# Patient Record
Sex: Male | Born: 1985 | Hispanic: Yes | Marital: Married | Smoking: Never smoker
Health system: Southern US, Community
[De-identification: ages and names within clinical notes are randomized; demographics above are authoritative.]

## PROBLEM LIST (undated history)

## (undated) DIAGNOSIS — B019 Varicella without complication: Secondary | ICD-10-CM

## (undated) HISTORY — DX: Varicella without complication: B01.9

## (undated) HISTORY — PX: NO PAST SURGERIES: SHX2092

---

## 2015-02-03 ENCOUNTER — Encounter (HOSPITAL_COMMUNITY): Payer: Self-pay | Admitting: Emergency Medicine

## 2015-02-03 ENCOUNTER — Emergency Department (HOSPITAL_COMMUNITY)
Admission: EM | Admit: 2015-02-03 | Discharge: 2015-02-03 | Disposition: A | Discharge status: Home / Self Care | Payer: BLUE CROSS/BLUE SHIELD | Attending: Emergency Medicine | Admitting: Emergency Medicine

## 2015-02-03 DIAGNOSIS — Z79899 Other long term (current) drug therapy: Secondary | ICD-10-CM | POA: Insufficient documentation

## 2015-02-03 DIAGNOSIS — K2970 Gastritis, unspecified, without bleeding: Secondary | ICD-10-CM | POA: Diagnosis not present

## 2015-02-03 DIAGNOSIS — R1013 Epigastric pain: Secondary | ICD-10-CM

## 2015-02-03 LAB — COMPREHENSIVE METABOLIC PANEL
ALBUMIN: 4.7 g/dL (ref 3.5–5.0)
ALK PHOS: 109 U/L (ref 38–126)
ALT: 82 U/L — AB (ref 17–63)
ANION GAP: 8 (ref 5–15)
AST: 33 U/L (ref 15–41)
BUN: 17 mg/dL (ref 6–20)
CALCIUM: 9.3 mg/dL (ref 8.9–10.3)
CO2: 24 mmol/L (ref 22–32)
Chloride: 105 mmol/L (ref 101–111)
Creatinine, Ser: 1.09 mg/dL (ref 0.61–1.24)
GFR calc Af Amer: >60 mL/min (ref >60)
GFR calc non Af Amer: >60 mL/min (ref >60)
GLUCOSE: 118 mg/dL — AB (ref 65–99)
Potassium: 4.3 mmol/L (ref 3.5–5.1)
SODIUM: 137 mmol/L (ref 135–145)
Total Bilirubin: 0.7 mg/dL (ref 0.3–1.2)
Total Protein: 7.6 g/dL (ref 6.5–8.1)

## 2015-02-03 LAB — CBC
HCT: 45.4 % (ref 39.0–52.0)
HEMOGLOBIN: 15.9 g/dL (ref 13.0–17.0)
MCH: 30.6 pg (ref 26.0–34.0)
MCHC: 35 g/dL (ref 30.0–36.0)
MCV: 87.3 fL (ref 78.0–100.0)
Platelets: 213 10*3/uL (ref 150–400)
RBC: 5.2 MIL/uL (ref 4.22–5.81)
RDW: 12.5 % (ref 11.5–15.5)
WBC: 10.3 10*3/uL (ref 4.0–10.5)

## 2015-02-03 LAB — LIPASE, BLOOD: Lipase: 36 U/L (ref 22–51)

## 2015-02-03 MED ORDER — ONDANSETRON 4 MG PO TBDP
4.0000 mg | ORAL_TABLET | Freq: Once | ORAL | Status: AC | PRN
  Administered 2015-02-03: 4 mg via ORAL
  Filled 2015-02-03: qty 1

## 2015-02-03 MED ORDER — PANTOPRAZOLE SODIUM 40 MG PO TBEC
40.0000 mg | DELAYED_RELEASE_TABLET | Freq: Every day | ORAL | Status: DC
Start: 2015-02-03 — End: 2015-02-03
  Administered 2015-02-03: 40 mg via ORAL
  Filled 2015-02-03: qty 1

## 2015-02-03 MED ORDER — OMEPRAZOLE 20 MG PO CPDR: 20.0000 mg | DELAYED_RELEASE_CAPSULE | Freq: Two times a day (BID) | ORAL | Status: DC

## 2015-02-03 MED ORDER — GI COCKTAIL ~~LOC~~
30.0000 mL | Freq: Once | ORAL | Status: AC
  Administered 2015-02-03: 30 mL via ORAL
  Filled 2015-02-03: qty 30

## 2015-02-03 NOTE — Discharge Instructions (Signed)

## 2015-02-03 NOTE — ED Provider Notes (Signed)
CSN: [id number]     Arrival date & time 02/03/15  0348 History   First MD Initiated Contact with Patient 02/03/15 [phone]     Chief Complaint  Patient presents with  . Abdominal Pain      HPI  Patient presents evaluation of epigastric abdominal pain. He ate at a taco truck tonight and developed some pressure in his upper abdomen and some burning in his lower chest. Nauseated. The like he had gas. Took some Pepto-Bismol. Symptoms were not improving. His father drove him here. He states en route his symptoms have improved down to a 1/10. Mild nausea initially. None now. No vomiting.  History reviewed. No pertinent past medical history. History reviewed. No pertinent past surgical history. History reviewed. No pertinent family history. Social History  Substance Use Topics  . Smoking status: Never Smoker   . Smokeless tobacco: None  . Alcohol Use: Yes     Comment: occ    Review of Systems  Constitutional: Negative for fever, chills, diaphoresis, appetite change and fatigue.  HENT: Negative for mouth sores, sore throat and trouble swallowing.   Eyes: Negative for visual disturbance.  Respiratory: Negative for cough, chest tightness, shortness of breath and wheezing.   Cardiovascular: Negative for chest pain.  Gastrointestinal: Positive for nausea and abdominal pain. Negative for vomiting, diarrhea and abdominal distention.  Endocrine: Negative for polydipsia, polyphagia and polyuria.  Genitourinary: Negative for dysuria, frequency and hematuria.  Musculoskeletal: Negative for gait problem.  Skin: Negative for color change, pallor and rash.  Neurological: Negative for dizziness, syncope, light-headedness and headaches.  Hematological: Does not bruise/bleed easily.  Psychiatric/Behavioral: Negative for behavioral problems and confusion.      Allergies  Review of patient's allergies indicates no known allergies.  Home Medications   Prior to Admission medications   Medication Sig  Start Date End Date Taking? Authorizing Provider  bismuth subsalicylate (PEPTO BISMOL) 262 MG chewable tablet Chew 1,048 mg by mouth once.   Yes Historical Provider, MD  Omega-3 Fatty Acids (FISH OIL PO) Take 1 capsule by mouth daily.   Yes Historical Provider, MD  omeprazole (PRILOSEC) 20 MG capsule Take 1 capsule (20 mg total) by mouth 2 (two) times daily. 02/03/15   Rolland PorterMark Jaidon Ellery, MD   BP 116/44 mmHg  Pulse 77  Temp(Src) 97.6 F (36.4 C) (Oral)  Resp 21  Ht 5\' 7"  (1.702 m)  Wt 210 lb (95.255 kg)  BMI 32.88 kg/m2  SpO2 95% Physical Exam  Constitutional: He is oriented to person, place, and time. He appears well-developed and well-nourished. No distress.  HENT:  Head: Normocephalic.  Eyes: Conjunctivae are normal. Pupils are equal, round, and reactive to light. No scleral icterus.  Neck: Normal range of motion. Neck supple. No thyromegaly present.  Cardiovascular: Normal rate and regular rhythm.  Exam reveals no gallop and no friction rub.   No murmur heard. Pulmonary/Chest: Effort normal and breath sounds normal. No respiratory distress. He has no wheezes. He has no rales.  Abdominal: Soft. Bowel sounds are normal. He exhibits no distension. There is no tenderness. There is no rebound.  Musculoskeletal: Normal range of motion.  Neurological: He is alert and oriented to person, place, and time.  Skin: Skin is warm and dry. No rash noted.  Psychiatric: He has a normal mood and affect. His behavior is normal.    ED Course  Procedures (including critical care time) Labs Review Labs Reviewed  COMPREHENSIVE METABOLIC PANEL - Abnormal; Notable for the following:    Glucose,  Bld 118 (*)    ALT 82 (*)    All other components within normal limits  LIPASE, BLOOD  CBC    Imaging Review No results found. I have personally reviewed and evaluated these images and lab results as part of my medical decision-making.   EKG Interpretation None      MDM   Final diagnoses:  Epigastric  pain  Gastritis    Symptoms and findings consistent with gastritis. Reassuring labs. Plan is home. Avoid anti-inflammatories, tobacco caffeine. He quit antiacids when necessary. Prilosec course.    Rolland Porter, MD 02/03/15 [phone]

## 2015-02-03 NOTE — ED Notes (Signed)
Patient c/o generalized abd pain after eating at a taco truck tonight. Patient states he felt like the food "didn't settle well", but now for past 3 hours has had bad abd pain. Patient took pepto bismol @ 2hours ago. Patient denies vomiting, but feels nauseated, denies diarrhea.

## 2015-05-21 ENCOUNTER — Other Ambulatory Visit: Payer: BLUE CROSS/BLUE SHIELD

## 2015-05-21 ENCOUNTER — Ambulatory Visit (INDEPENDENT_AMBULATORY_CARE_PROVIDER_SITE_OTHER): Payer: BLUE CROSS/BLUE SHIELD | Admitting: Internal Medicine

## 2015-05-21 ENCOUNTER — Encounter: Payer: Self-pay | Admitting: Internal Medicine

## 2015-05-21 VITALS — BP 102/76 | HR 76 | Temp 98.2°F | Resp 18 | Ht 67.0 in | Wt 211.0 lb

## 2015-05-21 DIAGNOSIS — B370 Candidal stomatitis: Secondary | ICD-10-CM

## 2015-05-21 DIAGNOSIS — Z0000 Encounter for general adult medical examination without abnormal findings: Secondary | ICD-10-CM | POA: Diagnosis not present

## 2015-05-21 DIAGNOSIS — K219 Gastro-esophageal reflux disease without esophagitis: Secondary | ICD-10-CM | POA: Diagnosis not present

## 2015-05-21 DIAGNOSIS — N509 Disorder of male genital organs, unspecified: Secondary | ICD-10-CM

## 2015-05-21 MED ORDER — THERA VITAL M PO TABS: 1.0000 | ORAL_TABLET | Freq: Every day | ORAL | Status: DC

## 2015-05-21 MED ORDER — OMEPRAZOLE 20 MG PO CPDR: 20.0000 mg | DELAYED_RELEASE_CAPSULE | Freq: Every day | ORAL | Status: DC

## 2015-05-21 NOTE — Progress Notes (Signed)
Pre visit review using our clinic review tool, if applicable. No additional management support is needed unless otherwise documented below in the visit note. 

## 2015-05-21 NOTE — Assessment & Plan Note (Signed)
Experiencing frequent GERD Review GERD diet Reviewed potential consequences of uncontrolled GERD Start omeprazole 20 mg daily-advised that he should take for at least 4 weeks and then try discontinuing Encouraged weight loss Discussed that this may be contributing to his bad breath Follow-up as needed

## 2015-05-21 NOTE — Patient Instructions (Signed)
We have reviewed your prior records including labs and tests today.  Test(s) ordered today. Your results will be released to MyChart (or called to you) after review, usually within 72hours after test completion. If any changes need to be made, you will be notified at that same time.  All other Health Maintenance issues reviewed.   All recommended immunizations and age-appropriate screenings are up-to-date.  No immunizations administered today.   Medications reviewed and updated.  Changes include starting omeprazole for your heartburn.  Take for 4 weeks and then stop.    Your prescription(s) have been submitted to your pharmacy. Please take as directed and contact our office if you believe you are having problem(s) with the medication(s).  A referral was ordered for urology  Gastroesophageal Reflux Disease, Adult Normally, food travels down the esophagus and stays in the stomach to be digested. However, when a person has gastroesophageal reflux disease (GERD), food and stomach acid move back up into the esophagus. When this happens, the esophagus becomes sore and inflamed. Over time, GERD can create small holes (ulcers) in the lining of the esophagus.  CAUSES This condition is caused by a problem with the muscle between the esophagus and the stomach (lower esophageal sphincter, or LES). Normally, the LES muscle closes after food passes through the esophagus to the stomach. When the LES is weakened or abnormal, it does not close properly, and that allows food and stomach acid to go back up into the esophagus. The LES can be weakened by certain dietary substances, medicines, and medical conditions, including:  Tobacco use.  Pregnancy.  Having a hiatal hernia.  Heavy alcohol use.  Certain foods and beverages, such as coffee, chocolate, onions, and peppermint. RISK FACTORS This condition is more likely to develop in:  People who have an increased body weight.  People who have connective  tissue disorders.  People who use NSAID medicines. SYMPTOMS Symptoms of this condition include:  Heartburn.  Difficult or painful swallowing.  The feeling of having a lump in the throat.  Abitter taste in the mouth.  Bad breath.  Having a large amount of saliva.  Having an upset or bloated stomach.  Belching.  Chest pain.  Shortness of breath or wheezing.  Ongoing (chronic) cough or a night-time cough.  Wearing away of tooth enamel.  Weight loss. Different conditions can cause chest pain. Make sure to see your health care provider if you experience chest pain. DIAGNOSIS Your health care provider will take a medical history and perform a physical exam. To determine if you have mild or severe GERD, your health care provider may also monitor how you respond to treatment. You may also have other tests, including:  An endoscopy toexamine your stomach and esophagus with a small camera.  A test thatmeasures the acidity level in your esophagus.  A test thatmeasures how much pressure is on your esophagus.  A barium swallow or modified barium swallow to show the shape, size, and functioning of your esophagus. TREATMENT The goal of treatment is to help relieve your symptoms and to prevent complications. Treatment for this condition may vary depending on how severe your symptoms are. Your health care provider may recommend:  Changes to your diet.  Medicine.  Surgery. HOME CARE INSTRUCTIONS Diet  Follow a diet as recommended by your health care provider. This may involve avoiding foods and drinks such as:  Coffee and tea (with or without caffeine).  Drinks that containalcohol.  Energy drinks and sports drinks.  Carbonated drinks  or sodas.  Chocolate and cocoa.  Peppermint and mint flavorings.  Garlic and onions.  Horseradish.  Spicy and acidic foods, including peppers, chili powder, curry powder, vinegar, hot sauces, and barbecue sauce.  Citrus fruit  juices and citrus fruits, such as oranges, lemons, and limes.  Tomato-based foods, such as red sauce, chili, salsa, and pizza with red sauce.  Fried and fatty foods, such as donuts, french fries, potato chips, and high-fat dressings.  High-fat meats, such as hot dogs and fatty cuts of red and white meats, such as rib eye steak, sausage, ham, and bacon.  High-fat dairy items, such as whole milk, butter, and cream cheese.  Eat small, frequent meals instead of large meals.  Avoid drinking large amounts of liquid with your meals.  Avoid eating meals during the 2-3 hours before bedtime.  Avoid lying down right after you eat.  Do not exercise right after you eat. General Instructions  Pay attention to any changes in your symptoms.  Take over-the-counter and prescription medicines only as told by your health care provider. Do not take aspirin, ibuprofen, or other NSAIDs unless your health care provider told you to do so.  Do not use any tobacco products, including cigarettes, chewing tobacco, and e-cigarettes. If you need help quitting, ask your health care provider.  Wear loose-fitting clothing. Do not wear anything tight around your waist that causes pressure on your abdomen.  Raise (elevate) the head of your bed 6 inches (15cm).  Try to reduce your stress, such as with yoga or meditation. If you need help reducing stress, ask your health care provider.  If you are overweight, reduce your weight to an amount that is healthy for you. Ask your health care provider for guidance about a safe weight loss goal.  Keep all follow-up visits as told by your health care provider. This is important. SEEK MEDICAL CARE IF:  You have new symptoms.  You have unexplained weight loss.  You have difficulty swallowing, or it hurts to swallow.  You have wheezing or a persistent cough.  Your symptoms do not improve with treatment.  You have a hoarse voice. SEEK IMMEDIATE MEDICAL CARE  IF:  You have pain in your arms, neck, jaw, teeth, or back.  You feel sweaty, dizzy, or light-headed.  You have chest pain or shortness of breath.  You vomit and your vomit looks like blood or coffee grounds.  You faint.  Your stool is bloody or black.  You cannot swallow, drink, or eat.   This information is not intended to replace advice given to you by your health care provider. Make sure you discuss any questions you have with your health care provider.   Document Released: 01/15/2005 Document Revised: 12/27/2014 Document Reviewed: 08/02/2014 Elsevier Interactive Patient Education 2016 [employer].    Health Maintenance, Male A healthy lifestyle and preventative care can promote health and wellness.  Maintain regular health, dental, and eye exams.  Eat a healthy diet. Foods like vegetables, fruits, whole grains, low-fat dairy products, and lean protein foods contain the nutrients you need and are low in calories. Decrease your intake of foods high in solid fats, added sugars, and salt. Get information about a proper diet from your health care provider, if necessary.  Regular physical exercise is one of the most important things you can do for your health. Most adults should get at least 150 minutes of moderate-intensity exercise (any activity that increases your heart rate and causes you to sweat) each week.  In addition, most adults need muscle-strengthening exercises on 2 or more days a week.   Maintain a healthy weight. The body mass index (BMI) is a screening tool to identify possible weight problems. It provides an estimate of body fat based on height and weight. Your health care provider can find your BMI and can help you achieve or maintain a healthy weight. For males 20 years and older:  A BMI below 18.5 is considered underweight.  A BMI of 18.5 to 24.9 is normal.  A BMI of 25 to 29.9 is considered overweight.  A BMI of 30 and above is considered  obese.  Maintain normal blood lipids and cholesterol by exercising and minimizing your intake of saturated fat. Eat a balanced diet with plenty of fruits and vegetables. Blood tests for lipids and cholesterol should begin at age 44 and be repeated every 5 years. If your lipid or cholesterol levels are high, you are over age 1, or you are at high risk for heart disease, you may need your cholesterol levels checked more frequently.Ongoing high lipid and cholesterol levels should be treated with medicines if diet and exercise are not working.  If you smoke, find out from your health care provider how to quit. If you do not use tobacco, do not start.  Lung cancer screening is recommended for adults aged 55-80 years who are at high risk for developing lung cancer because of a history of smoking. A yearly low-dose CT scan of the lungs is recommended for people who have at least a 30-pack-year history of smoking and are current smokers or have quit within the past 15 years. A pack year of smoking is smoking an average of 1 pack of cigarettes a day for 1 year (for example, a 30-pack-year history of smoking could mean smoking 1 pack a day for 30 years or 2 packs a day for 15 years). Yearly screening should continue until the smoker has stopped smoking for at least 15 years. Yearly screening should be stopped for people who develop a health problem that would prevent them from having lung cancer treatment.  If you choose to drink alcohol, do not have more than 2 drinks per day. One drink is considered to be 12 oz (360 mL) of beer, 5 oz (150 mL) of wine, or 1.5 oz (45 mL) of liquor.  Avoid the use of street drugs. Do not share needles with anyone. Ask for help if you need support or instructions about stopping the use of drugs.  High blood pressure causes heart disease and increases the risk of stroke. High blood pressure is more likely to develop in:  People who have blood pressure in the end of the normal  range (100-139/85-89 mm Hg).  People who are overweight or obese.  People who are African American.  If you are 58-17 years of age, have your blood pressure checked every 3-5 years. If you are 68 years of age or older, have your blood pressure checked every year. You should have your blood pressure measured twice--once when you are at a hospital or clinic, and once when you are not at a hospital or clinic. Record the average of the two measurements. To check your blood pressure when you are not at a hospital or clinic, you can use:  An automated blood pressure machine at a pharmacy.  A home blood pressure monitor.  If you are 61-82 years old, ask your health care provider if you should take aspirin to prevent heart  disease.  Diabetes screening involves taking a blood sample to check your fasting blood sugar level. This should be done once every 3 years after age 54 if you are at a normal weight and without risk factors for diabetes. Testing should be considered at a younger age or be carried out more frequently if you are overweight and have at least 1 risk factor for diabetes.  Colorectal cancer can be detected and often prevented. Most routine colorectal cancer screening begins at the age of 53 and continues through age 90. However, your health care provider may recommend screening at an earlier age if you have risk factors for colon cancer. On a yearly basis, your health care provider may provide home test kits to check for hidden blood in the stool. A small camera at the end of a tube may be used to directly examine the colon (sigmoidoscopy or colonoscopy) to detect the earliest forms of colorectal cancer. Talk to your health care provider about this at age 17 when routine screening begins. A direct exam of the colon should be repeated every 5-10 years through age 1, unless early forms of precancerous polyps or small growths are found.  People who are at an increased risk for hepatitis B  should be screened for this virus. You are considered at high risk for hepatitis B if:  You were born in a country where hepatitis B occurs often. Talk with your health care provider about which countries are considered high risk.  Your parents were born in a high-risk country and you have not received a shot to protect against hepatitis B (hepatitis B vaccine).  You have HIV or AIDS.  You use needles to inject street drugs.  You live with, or have sex with, someone who has hepatitis B.  You are a man who has sex with other men (MSM).  You get hemodialysis treatment.  You take certain medicines for conditions like cancer, organ transplantation, and autoimmune conditions.  Hepatitis C blood testing is recommended for all people born from [birth year] through 1967 and any individual with known risk factors for hepatitis C.  Healthy men should no longer receive prostate-specific antigen (PSA) blood tests as part of routine cancer screening. Talk to your health care provider about prostate cancer screening.  Testicular cancer screening is not recommended for adolescents or adult males who have no symptoms. Screening includes self-exam, a health care provider exam, and other screening tests. Consult with your health care provider about any symptoms you have or any concerns you have about testicular cancer.  Practice safe sex. Use condoms and avoid high-risk sexual practices to reduce the spread of sexually transmitted infections (STIs).  You should be screened for STIs, including gonorrhea and chlamydia if:  You are sexually active and are younger than 24 years.  You are older than 24 years, and your health care provider tells you that you are at risk for this type of infection.  Your sexual activity has changed since you were last screened, and you are at an increased risk for chlamydia or gonorrhea. Ask your health care provider if you are at risk.  If you are at risk of being infected with  HIV, it is recommended that you take a prescription medicine daily to prevent HIV infection. This is called pre-exposure prophylaxis (PrEP). You are considered at risk if:  You are a man who has sex with other men (MSM).  You are a heterosexual man who is sexually active with multiple partners.  You take drugs by injection.  You are sexually active with a partner who has HIV.  Talk with your health care provider about whether you are at high risk of being infected with HIV. If you choose to begin PrEP, you should first be tested for HIV. You should then be tested every 3 months for as long as you are taking PrEP.  Use sunscreen. Apply sunscreen liberally and repeatedly throughout the day. You should seek shade when your shadow is shorter than you. Protect yourself by wearing long sleeves, pants, a wide-brimmed hat, and sunglasses year round whenever you are outdoors.  Tell your health care provider of new moles or changes in moles, especially if there is a change in shape or color. Also, tell your health care provider if a mole is larger than the size of a pencil eraser.  A one-time screening for abdominal aortic aneurysm (AAA) and surgical repair of large AAAs by ultrasound is recommended for men aged 65-75 years who are current or former smokers.  Stay current with your vaccines (immunizations).   This information is not intended to replace advice given to you by your health care provider. Make sure you discuss any questions you have with your health care provider.   Document Released: 10/04/2007 Document Revised: 04/28/2014 Document Reviewed: 09/02/2010 Elsevier Interactive Patient Education [employer]

## 2015-05-21 NOTE — Progress Notes (Signed)
Subjective:    Patient ID: Alexander Mcclain, male    DOB: October 06, 1985, 30 y.o.   MRN: [id number]  HPI He is here to establish with a new primary care physician and would like a physical.  He occasional has blood on the tissue when he wipes after having a bowel movement.  It occurs randomly.  He does have history of a hemorrhoids.  He denies blood in his stool.  He sometimes goes frequently - up to 3 times a day, which is when he typically sees the blood on tissue. He denies any palpable hemorrhoid. He denies any rectal pain. His weight has been stable and he denies change in appetite. He denies abdominal pain. He denies a family history of colon cancer or inflammatory bowel disease.   He has a wart or a mole on his scrotum.  It has been there a year.  No pain, no itching.  No change.  It is raised a little. His wife does have HPV.   He brushes his teeth and scrubs his tongue and this tongue will always be white.  He has bad breath and can not control it.  He does not see his dentist regularly-It has been a while.  He occasionally has GERD.  He has had gastritis.  He  typically it is has GERD 1-2 times a week.  He eats a lot of spicy foods.    Medications and allergies reviewed with patient and updated if appropriate.  Patient Active Problem List   Diagnosis Date Noted  . GERD (gastroesophageal reflux disease) 05/21/2015    Current Outpatient Prescriptions on File Prior to Visit  Medication Sig Dispense Refill  . Omega-3 Fatty Acids (FISH OIL PO) Take 1 capsule by mouth daily.     No current facility-administered medications on file prior to visit.    Past Medical History  Diagnosis Date  . Chicken pox     Past Surgical History  Procedure Laterality Date  . No past surgeries      Social History   Social History  . Marital Status: Married    Spouse Name: N/A  . Number of Children: N/A  . Years of Education: N/A   Social History Main Topics  . Smoking status: Never Smoker     . Smokeless tobacco: Never Used  . Alcohol Use: Yes     Comment: occ  . Drug Use: No  . Sexual Activity: Not Asked   Other Topics Concern  . None   Social History Narrative   Exercise: regular at least twice a week    Family History  Problem Relation Age of Onset  . Stroke Maternal Grandmother   . Diabetes Maternal Grandmother   . Diabetes Paternal Grandmother   . Diabetes Paternal Grandfather   . Hyperlipidemia Father   . Diabetes Maternal Grandfather     Review of Systems  Constitutional: Negative for fever, chills, appetite change and unexpected weight change.  HENT: Positive for congestion (allergies) and postnasal drip (allergies). Negative for hearing loss and trouble swallowing.   Eyes: Negative for visual disturbance.  Respiratory: Positive for cough and wheezing (related to allergies). Negative for shortness of breath.   Cardiovascular: Negative for chest pain, palpitations and leg swelling.  Gastrointestinal: Negative for nausea, abdominal pain, diarrhea, constipation and blood in stool.       GERD 1-2 times a week, stool vary diarrhea - constipation, nothing consistent  Genitourinary: Positive for genital sores (on scrotum). Negative for dysuria and hematuria.  Musculoskeletal: Negative for myalgias, back pain and arthralgias.  Neurological: Negative for dizziness, light-headedness and headaches.  Psychiatric/Behavioral: Negative for dysphoric mood. The patient is not nervous/anxious.        Objective:   Filed Vitals:   05/21/15 0900  BP: 102/76  Pulse: 76  Temp: 98.2 F (36.8 C)  Resp: 18   Filed Weights   05/21/15 0900  Weight: 211 lb (95.709 kg)   Body mass index is 33.04 kg/(m^2).   Physical Exam Constitutional: He appears well-developed and well-nourished. No distress.  HENT:  Head: Normocephalic and atraumatic.  Right Ear: External ear normal.  Left Ear: External ear normal.  Mouth/Throat: Oropharynx is  moist. Tongue and roof of mouth  has a white coating  Normal ear canals and TM b/l  Eyes: Conjunctivae and EOM are normal.  Neck: Neck supple. No tracheal deviation present. No thyromegaly present.  No carotid bruit  Cardiovascular: Normal rate, regular rhythm, normal heart sounds and intact distal pulses.   No murmur heard. Pulmonary/Chest: Effort normal and breath sounds normal. No respiratory distress. He has no wheezes. He has no rales.  Abdominal: Soft. Bowel sounds are normal. He exhibits no distension. There is no tenderness.  Genitourinary: white colored raised lesion on scrotum, nontender, no other abnormalities  Musculoskeletal: He exhibits no edema.  Lymphadenopathy:    He has no cervical adenopathy.  Skin: Skin is warm and dry. He is not diaphoretic.  Psychiatric: He has a normal mood and affect. His behavior is normal.         Assessment & Plan:    Physical exam: Screening blood work ordered Immunizations up-to-date Stressed increasing his regular exercise Advised weight loss No evidence of substance abuse We will refer to urology for further evaluation of the skin at the scrotum-possible HPV versus benign cyst Discussed self testicular exams Discussed skin cancer prevention awareness  Possible thrush-white coating on tongue and roof of mouth  Tongue culture sent Encouraged him to follow-up with his dentist, especially since he has not been seen recently and is experiencing not only bad breath, but also with coating on his tongue and roof of mouth  Advised to follow-up at least once a year, sooner depending on results of blood work

## 2015-05-28 LAB — CULTURE, FUNGUS WITHOUT SMEAR: ORGANISM ID, BACTERIA: NO GROWTH

## 2015-05-29 ENCOUNTER — Encounter: Payer: Self-pay | Admitting: Internal Medicine

## 2015-06-14 ENCOUNTER — Other Ambulatory Visit (INDEPENDENT_AMBULATORY_CARE_PROVIDER_SITE_OTHER): Payer: BLUE CROSS/BLUE SHIELD

## 2015-06-14 DIAGNOSIS — Z0000 Encounter for general adult medical examination without abnormal findings: Secondary | ICD-10-CM

## 2015-06-14 LAB — COMPREHENSIVE METABOLIC PANEL
ALBUMIN: 4.8 g/dL (ref 3.5–5.2)
ALT: 80 U/L — ABNORMAL HIGH (ref 0–53)
AST: 32 U/L (ref 0–37)
Alkaline Phosphatase: 94 U/L (ref 39–117)
BUN: 14 mg/dL (ref 6–23)
CALCIUM: 9.8 mg/dL (ref 8.4–10.5)
CHLORIDE: 103 meq/L (ref 96–112)
CO2: 29 meq/L (ref 19–32)
Creatinine, Ser: 0.92 mg/dL (ref 0.40–1.50)
GFR: 101.7 mL/min (ref >60.00)
Glucose, Bld: 99 mg/dL (ref 70–99)
POTASSIUM: 4.2 meq/L (ref 3.5–5.1)
SODIUM: 139 meq/L (ref 135–145)
Total Bilirubin: 0.8 mg/dL (ref 0.2–1.2)
Total Protein: 8 g/dL (ref 6.0–8.3)

## 2015-06-14 LAB — TSH: TSH: 1.57 u[IU]/mL (ref 0.35–4.50)

## 2015-06-14 LAB — CBC WITH DIFFERENTIAL/PLATELET
BASOS PCT: 0.5 % (ref 0.0–3.0)
Basophils Absolute: 0 10*3/uL (ref 0.0–0.1)
EOS ABS: 0.4 10*3/uL (ref 0.0–0.7)
EOS PCT: 5.9 % — AB (ref 0.0–5.0)
HCT: 45.5 % (ref 39.0–52.0)
HEMOGLOBIN: 16 g/dL (ref 13.0–17.0)
Lymphocytes Relative: 25.2 % (ref 12.0–46.0)
Lymphs Abs: 1.8 10*3/uL (ref 0.7–4.0)
MCHC: 35.1 g/dL (ref 30.0–36.0)
MCV: 86.6 fl (ref 78.0–100.0)
MONO ABS: 0.6 10*3/uL (ref 0.1–1.0)
Monocytes Relative: 7.9 % (ref 3.0–12.0)
NEUTROS ABS: 4.4 10*3/uL (ref 1.4–7.7)
Neutrophils Relative %: 60.5 % (ref 43.0–77.0)
PLATELETS: 194 10*3/uL (ref 150.0–400.0)
RBC: 5.26 Mil/uL (ref 4.22–5.81)
RDW: 13.1 % (ref 11.5–15.5)
WBC: 7.2 10*3/uL (ref 4.0–10.5)

## 2015-06-14 LAB — LIPID PANEL
CHOL/HDL RATIO: 5
CHOLESTEROL: 147 mg/dL (ref 0–200)
HDL: 30.2 mg/dL — ABNORMAL LOW (ref >39.00)
LDL CALC: 81 mg/dL (ref 0–99)
NonHDL: 116.94
TRIGLYCERIDES: 181 mg/dL — AB (ref 0.0–149.0)
VLDL: 36.2 mg/dL (ref 0.0–40.0)

## 2015-06-14 LAB — HEMOGLOBIN A1C: HEMOGLOBIN A1C: 5 % (ref 4.6–6.5)

## 2015-06-15 ENCOUNTER — Telehealth: Payer: Self-pay | Admitting: Emergency Medicine

## 2015-06-15 NOTE — Telephone Encounter (Signed)
Only one of his liver tests is slightly elevated - the other are normal so that tells me there is no major liver disease or process.  Most commonly the ALT is elevated when someone is overweight - weight loss is the only treatment.  He can work on weight loss and we can recheck his liver tests in a few months .  There are more than one type of liver tumor so I can not comment on the students case without knowing any specifics

## 2015-06-15 NOTE — Telephone Encounter (Signed)
Spoke with pt to inform of lab results. Pt is concerned with liver tests being slightly elevated. 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 Denies taking large amounts of NSAIDS or drinking often.

## 2015-06-15 NOTE — Telephone Encounter (Signed)
LVM for pt to call back.

## 2015-06-15 NOTE — Telephone Encounter (Signed)
-----   Message from Pincus Sanes, MD sent at 06/14/2015  8:57 PM EST ----- Thyroid function, kidney function, sugar, blood counts are normal.  One of your liver tests is slightly elevated, but the rest are normal.  Your cholesterol is ok - your LDL is good, your triglycerides are elevated, which is a bad cholesterol very reliant on lifestyle and your HDL or good cholesterol is a little low.  If you make lifestyle changes (exercise, weight loss and a healthy diet your cholesterol will improve).

## 2015-06-18 NOTE — Telephone Encounter (Signed)
Spoke with pt to inform.  

## 2015-12-10 ENCOUNTER — Encounter: Payer: Self-pay | Admitting: Internal Medicine

## 2015-12-10 ENCOUNTER — Ambulatory Visit (INDEPENDENT_AMBULATORY_CARE_PROVIDER_SITE_OTHER): Payer: BLUE CROSS/BLUE SHIELD | Admitting: Internal Medicine

## 2015-12-10 ENCOUNTER — Ambulatory Visit (INDEPENDENT_AMBULATORY_CARE_PROVIDER_SITE_OTHER)
Admission: RE | Admit: 2015-12-10 | Discharge: 2015-12-10 | Disposition: A | Discharge status: Home / Self Care | Payer: BLUE CROSS/BLUE SHIELD | Source: Ambulatory Visit | Attending: Internal Medicine | Admitting: Internal Medicine

## 2015-12-10 VITALS — BP 106/72 | HR 64 | Temp 98.4°F | Resp 16 | Wt 208.0 lb

## 2015-12-10 DIAGNOSIS — M546 Pain in thoracic spine: Secondary | ICD-10-CM | POA: Insufficient documentation

## 2015-12-10 DIAGNOSIS — Z1283 Encounter for screening for malignant neoplasm of skin: Secondary | ICD-10-CM | POA: Diagnosis not present

## 2015-12-10 NOTE — Assessment & Plan Note (Signed)
Skin tags on neck - one has increased in size May need to have them removed  Refer to derm

## 2015-12-10 NOTE — Progress Notes (Signed)
Subjective:    Patient ID: Alexander CommonGustavo Mcclain, male    DOB: 1985/08/31, 30 y.o.   MRN: [id number]  HPI The patient is here for follow up.  Back pain:  He has had back pain since January and it has gotten worse.  He now has pain daily.  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  He denies any injury.  The pain is located near his left scapula.  Nothing makes it worse - it may be worse at the end of the day.  Laying down makes it better.  He has not tried advil or tylenol.  He denies numbness or tingling.  He denies lower back pain or headaches.    Mole on skin: He has a mole on his neck and it has gotten bigger.  He was interested in seeing dermatology.  He denies other skin concerns.   Medications and allergies reviewed with patient and updated if appropriate.  Patient Active Problem List   Diagnosis Date Noted  . GERD (gastroesophageal reflux disease) 05/21/2015    Current Outpatient Prescriptions on File Prior to Visit  Medication Sig Dispense Refill  . Multiple Vitamins-Minerals (MULTIVITAMIN) tablet Take 1 tablet by mouth daily.    . Omega-3 Fatty Acids (FISH OIL PO) Take 1 capsule by mouth daily.    Marland Kitchen. omeprazole (PRILOSEC) 20 MG capsule Take 1 capsule (20 mg total) by mouth daily. 30 capsule 3   No current facility-administered medications on file prior to visit.     Past Medical History:  Diagnosis Date  . Chicken pox     Past Surgical History:  Procedure Laterality Date  . NO PAST SURGERIES      Social History   Social History  . Marital status: Married    Spouse name: N/A  . Number of children: N/A  . Years of education: N/A   Social History Main Topics  . Smoking status: Never Smoker  . Smokeless tobacco: Never Used  . Alcohol use Yes     Comment: occ  . Drug use: No  . Sexual activity: Not on file   Other Topics Concern  . Not on file   Social History Narrative   Exercise: regular at least twice a week    Family History  Problem Relation Age of Onset  .  Stroke Maternal Grandmother   . Diabetes Maternal Grandmother   . Diabetes Paternal Grandmother   . Diabetes Paternal Grandfather   . Hyperlipidemia Father   . Diabetes Maternal Grandfather     Review of Systems  Constitutional: Negative for appetite change, fever and unexpected weight change.  Respiratory: Negative for cough, shortness of breath and wheezing.   Cardiovascular: Negative for chest pain, palpitations and leg swelling.  Musculoskeletal: Negative for neck pain and neck stiffness.  Neurological: Negative for dizziness, weakness, light-headedness, numbness and headaches.       Objective:   Vitals:   12/10/15 1547  BP: 106/72  Pulse: 64  Resp: 16  Temp: 98.4 F (36.9 C)   Filed Weights   12/10/15 1547  Weight: 208 lb (94.3 kg)   Body mass index is 32.58 kg/m.   Physical Exam    Constitutional: Appears well-developed and well-nourished. No distress.  HENT:  Head: Normocephalic and atraumatic.  Neck: Neck supple. No tracheal deviation present. No thyromegaly present.  Cardiovascular: Normal rate, regular rhythm and normal heart sounds.   No murmur heard. Pulmonary/Chest: Effort normal and breath sounds normal. No respiratory distress. No has  no wheezes. No rales.  Musculoskeletal: No edema.  no tenderness in left scapular region with palpation, some increased pain with movement of left arm Lymphadenopathy: No cervical adenopathy.  Skin: Skin is warm and dry. Not diaphoretic. few skin tags on neck Psychiatric: Normal mood and affect. Behavior is normal.     Assessment & Plan:    See Problem List for Assessment and Plan of chronic medical problems.

## 2015-12-10 NOTE — Progress Notes (Signed)
Pre visit review using our clinic review tool, if applicable. No additional management support is needed unless otherwise documented below in the visit note. 

## 2015-12-10 NOTE — Assessment & Plan Note (Signed)
Likely muscular, but not tender to palpation Will check an xray - he is concerned about a tumor - an xray is reasonable advil Stretching Change work environment Will refer to Dr Katrinka BlazingSmith for further evaluation, but advised he can cancel/not schedule if pain improves

## 2015-12-10 NOTE — Patient Instructions (Signed)
Have the xray done today. We will call you with the results  Take advil, stretch the area and change your work environment.   Referrals were ordered for sports medicine and dermatology.

## 2015-12-11 ENCOUNTER — Telehealth: Payer: Self-pay | Admitting: Internal Medicine

## 2015-12-11 NOTE — Telephone Encounter (Signed)
Spoke with pt to clarify.  

## 2015-12-11 NOTE — Telephone Encounter (Signed)
Patient called in to get x ray results.  Did give MD response.  Patient is requesting call back to go over this.

## 2015-12-26 ENCOUNTER — Ambulatory Visit: Payer: BLUE CROSS/BLUE SHIELD | Admitting: Internal Medicine

## 2016-08-17 NOTE — Progress Notes (Signed)
Subjective:    Patient ID: Alexander Mcclain, male    DOB: 1985/12/25, 31 y.o.   MRN: [id number]  HPI He is here for a physical exam.     Right sided lower abdominal pain:  Three weeks ago he had pain in his RLQ and radiated up to the chest and then down to his testicle.  It was a sharp pain in the RLQ and dull pain in his testicle.  He was urinating a lot.  He still feels the pain sometimes - it is a dull ache. It started out of the blue - he denies any lfiting or activity that caused.  He did not have dysuria or hematuria.  He denies changes in bowels at that time.  He denies testicle swelling.    He has gained weight.  He has not been able to exercise regulary, but will restart.  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    Medications and allergies reviewed with patient and updated if appropriate.  Patient Active Problem List   Diagnosis Date Noted  . Left-sided thoracic back pain 12/10/2015    Current Outpatient Prescriptions on File Prior to Visit  Medication Sig Dispense Refill  . Multiple Vitamins-Minerals (MULTIVITAMIN) tablet Take 1 tablet by mouth daily.    . Omega-3 Fatty Acids (FISH OIL PO) Take 1 capsule by mouth daily.     No current facility-administered medications on file prior to visit.     Past Medical History:  Diagnosis Date  . Chicken pox     Past Surgical History:  Procedure Laterality Date  . NO PAST SURGERIES      Social History   Social History  . Marital status: Married    Spouse name: N/A  . Number of children: N/A  . Years of education: N/A   Social History Main Topics  . Smoking status: Never Smoker  . Smokeless tobacco: Never Used  . Alcohol use Yes     Comment: occ  . Drug use: No  . Sexual activity: Not Asked   Other Topics Concern  . None   Social History Narrative   Exercise: not regular    Family History  Problem Relation Age of Onset  . Hyperlipidemia Father   . Stroke Maternal Grandmother   . Diabetes  Maternal Grandmother   . Diabetes Paternal Grandmother   . Diabetes Paternal Grandfather   . Diabetes Maternal Grandfather     Review of Systems  Constitutional: Positive for fatigue. Negative for appetite change, chills and fever.  Eyes: Negative for visual disturbance.  Respiratory: Negative for cough, shortness of breath and wheezing.   Cardiovascular: Negative for chest pain, palpitations and leg swelling.  Gastrointestinal: Positive for abdominal pain and anal bleeding (occasional). Negative for blood in stool, constipation, diarrhea and nausea.       No gerd  Genitourinary: Positive for testicular pain. Negative for dysuria, hematuria and scrotal swelling.  Musculoskeletal: Negative for arthralgias, back pain and myalgias.  Skin: Negative for color change and rash.  Neurological: Positive for numbness (occ numbness). Negative for light-headedness and headaches.  Psychiatric/Behavioral: Negative for dysphoric mood. The patient is not nervous/anxious.        Objective:   Vitals:   08/19/16 0908  BP: 102/62  Pulse: 75  Resp: 16  Temp: 98.3 F (36.8 C)   Filed Weights   08/19/16 0908  Weight: 211 lb (95.7 kg)   Body mass index is 33.05 kg/m.  Wt Readings  from Last 3 Encounters:  08/19/16 211 lb (95.7 kg)  12/10/15 208 lb (94.3 kg)  05/21/15 211 lb (95.7 kg)     Physical Exam Constitutional: He appears well-developed and well-nourished. No distress.  HENT:  Head: Normocephalic and atraumatic.  Right Ear: External ear normal.  Left Ear: External ear normal.  Mouth/Throat: Oropharynx is clear and moist.  Normal ear canals and TM b/l  Eyes: Conjunctivae and EOM are normal.  Neck: Neck supple. No tracheal deviation present. No thyromegaly present.  No carotid bruit  Cardiovascular: Normal rate, regular rhythm, normal heart sounds and intact distal pulses.   No murmur heard. Pulmonary/Chest: Effort normal and breath sounds normal. No respiratory distress. He has  no wheezes. He has no rales.  Abdominal: Soft.  He exhibits no distension. There is minimal tenderness in his RLQ that is localized. no other abdominal tenderness Genitourinary: deferred  - his self exam was normal - will defer to urology Musculoskeletal: He exhibits no edema.  Lymphadenopathy:   He has no cervical adenopathy.  Skin: Skin is warm and dry. He is not diaphoretic.  Psychiatric: He has a normal mood and affect. His behavior is normal.         Assessment & Plan:    Physical exam: Screening blood work  ordered Immunizations   Up to date  Eye exam:  Up to date  Exercise - not regular, will restart Weight - plans on losing weight Skin  No concerns Substance abuse  none  See Problem List for Assessment and Plan of chronic medical problems.

## 2016-08-17 NOTE — Patient Instructions (Addendum)
Test(s) ordered today. Your results will be released to MyChart (or called to you) after review, usually within 72hours after test completion. If any changes need to be made, you will be notified at that same time.  All other Health Maintenance issues reviewed.   All recommended immunizations and age-appropriate screenings are up-to-date or discussed.  No immunizations administered today.   Medications reviewed and updated.  No changes recommended at this time.   A referral was ordered for urology  Please followup in one year, sooner if needed    Health Maintenance, Male A healthy lifestyle and preventive care is important for your health and wellness. Ask your health care provider about what schedule of regular examinations is right for you. What should I know about weight and diet?  Eat a Healthy Diet  Eat plenty of vegetables, fruits, whole grains, low-fat dairy products, and lean protein.  Do not eat a lot of foods high in solid fats, added sugars, or salt. Maintain a Healthy Weight  Regular exercise can help you achieve or maintain a healthy weight. You should:  Do at least 150 minutes of exercise each week. The exercise should increase your heart rate and make you sweat (moderate-intensity exercise).  Do strength-training exercises at least twice a week. Watch Your Levels of Cholesterol and Blood Lipids  Have your blood tested for lipids and cholesterol every 5 years starting at 33 years of age. If you are at high risk for heart disease, you should start having your blood tested when you are 33 years old. You may need to have your cholesterol levels checked more often if:  Your lipid or cholesterol levels are high.  You are older than 33 years of age.  You are at high risk for heart disease. What should I know about cancer screening? Many types of cancers can be detected early and may often be prevented. Lung Cancer  You should be screened every year for lung cancer  if:  You are a current smoker who has smoked for at least 30 years.  You are a former smoker who has quit within the past 15 years.  Talk to your health care provider about your screening options, when you should start screening, and how often you should be screened. Colorectal Cancer  Routine colorectal cancer screening usually begins at 33 years of age and should be repeated every 5-10 years until you are 33 years old. You may need to be screened more often if early forms of precancerous polyps or small growths are found. Your health care provider may recommend screening at an earlier age if you have risk factors for colon cancer.  Your health care provider may recommend using home test kits to check for hidden blood in the stool.  A small camera at the end of a tube can be used to examine your colon (sigmoidoscopy or colonoscopy). This checks for the earliest forms of colorectal cancer. Prostate and Testicular Cancer  Depending on your age and overall health, your health care provider may do certain tests to screen for prostate and testicular cancer.  Talk to your health care provider about any symptoms or concerns you have about testicular or prostate cancer. Skin Cancer  Check your skin from head to toe regularly.  Tell your health care provider about any new moles or changes in moles, especially if:  There is a change in a mole's size, shape, or color.  You have a mole that is larger than a pencil eraser.  Always use sunscreen. Apply sunscreen liberally and repeat throughout the day.  Protect yourself by wearing long sleeves, pants, a wide-brimmed hat, and sunglasses when outside. What should I know about heart disease, diabetes, and high blood pressure?  If you are 58-66 years of age, have your blood pressure checked every 3-5 years. If you are 47 years of age or older, have your blood pressure checked every year. You should have your blood pressure measured twice-once when  you are at a hospital or clinic, and once when you are not at a hospital or clinic. Record the average of the two measurements. To check your blood pressure when you are not at a hospital or clinic, you can use:  An automated blood pressure machine at a pharmacy.  A home blood pressure monitor.  Talk to your health care provider about your target blood pressure.  If you are between 44-47 years old, ask your health care provider if you should take aspirin to prevent heart disease.  Have regular diabetes screenings by checking your fasting blood sugar level.  If you are at a normal weight and have a low risk for diabetes, have this test once every three years after the age of 56.  If you are overweight and have a high risk for diabetes, consider being tested at a younger age or more often.  A one-time screening for abdominal aortic aneurysm (AAA) by ultrasound is recommended for men aged 65-75 years who are current or former smokers. What should I know about preventing infection? Hepatitis B  If you have a higher risk for hepatitis B, you should be screened for this virus. Talk with your health care provider to find out if you are at risk for hepatitis B infection. Hepatitis C  Blood testing is recommended for:  Everyone born from [birth year] through 1967.  Anyone with known risk factors for hepatitis C. Sexually Transmitted Diseases (STDs)  You should be screened each year for STDs including gonorrhea and chlamydia if:  You are sexually active and are younger than 33 years of age.  You are older than 33 years of age and your health care provider tells you that you are at risk for this type of infection.  Your sexual activity has changed since you were last screened and you are at an increased risk for chlamydia or gonorrhea. Ask your health care provider if you are at risk.  Talk with your health care provider about whether you are at high risk of being infected with HIV. Your health care  provider may recommend a prescription medicine to help prevent HIV infection. What else can I do?  Schedule regular health, dental, and eye exams.  Stay current with your vaccines (immunizations).  Do not use any tobacco products, such as cigarettes, chewing tobacco, and e-cigarettes. If you need help quitting, ask your health care provider.  Limit alcohol intake to no more than 2 drinks per day. One drink equals 12 ounces of beer, 5 ounces of wine, or 1 ounces of hard liquor.  Do not use street drugs.  Do not share needles.  Ask your health care provider for help if you need support or information about quitting drugs.  Tell your health care provider if you often feel depressed.  Tell your health care provider if you have ever been abused or do not feel safe at home. This information is not intended to replace advice given to you by your health care provider. Make sure you discuss any questions  you have with your health care provider. Document Released: 10/04/2007 Document Revised: 12/05/2015 Document Reviewed: 01/09/2015 Elsevier Interactive Patient Education  2017 [employer].

## 2016-08-19 ENCOUNTER — Other Ambulatory Visit (INDEPENDENT_AMBULATORY_CARE_PROVIDER_SITE_OTHER): Payer: BLUE CROSS/BLUE SHIELD

## 2016-08-19 ENCOUNTER — Ambulatory Visit (INDEPENDENT_AMBULATORY_CARE_PROVIDER_SITE_OTHER): Payer: BLUE CROSS/BLUE SHIELD | Admitting: Internal Medicine

## 2016-08-19 ENCOUNTER — Encounter: Payer: Self-pay | Admitting: Internal Medicine

## 2016-08-19 VITALS — BP 102/62 | HR 75 | Temp 98.3°F | Resp 16 | Ht 67.0 in | Wt 211.0 lb

## 2016-08-19 DIAGNOSIS — Z0000 Encounter for general adult medical examination without abnormal findings: Secondary | ICD-10-CM

## 2016-08-19 DIAGNOSIS — N509 Disorder of male genital organs, unspecified: Secondary | ICD-10-CM

## 2016-08-19 DIAGNOSIS — N50819 Testicular pain, unspecified: Secondary | ICD-10-CM

## 2016-08-19 DIAGNOSIS — R10823 Right lower quadrant rebound abdominal tenderness: Secondary | ICD-10-CM

## 2016-08-19 DIAGNOSIS — R739 Hyperglycemia, unspecified: Secondary | ICD-10-CM

## 2016-08-19 LAB — URINALYSIS, ROUTINE W REFLEX MICROSCOPIC
BILIRUBIN URINE: NEGATIVE
Hgb urine dipstick: NEGATIVE
Ketones, ur: NEGATIVE
Leukocytes, UA: NEGATIVE
Nitrite: NEGATIVE
PH: 6 (ref 5.0–8.0)
RBC / HPF: NONE SEEN (ref 0–?)
Specific Gravity, Urine: 1.025 (ref 1.000–1.030)
TOTAL PROTEIN, URINE-UPE24: NEGATIVE
URINE GLUCOSE: NEGATIVE
UROBILINOGEN UA: 0.2 (ref 0.0–1.0)
WBC, UA: NONE SEEN (ref 0–?)

## 2016-08-19 LAB — COMPREHENSIVE METABOLIC PANEL
ALT: 74 U/L — ABNORMAL HIGH (ref 0–53)
AST: 32 U/L (ref 0–37)
Albumin: 4.9 g/dL (ref 3.5–5.2)
Alkaline Phosphatase: 88 U/L (ref 39–117)
BUN: 14 mg/dL (ref 6–23)
CALCIUM: 10.1 mg/dL (ref 8.4–10.5)
CHLORIDE: 103 meq/L (ref 96–112)
CO2: 29 meq/L (ref 19–32)
Creatinine, Ser: 0.93 mg/dL (ref 0.40–1.50)
GFR: 99.69 mL/min (ref >60.00)
Glucose, Bld: 89 mg/dL (ref 70–99)
POTASSIUM: 4.6 meq/L (ref 3.5–5.1)
Sodium: 138 mEq/L (ref 135–145)
TOTAL PROTEIN: 8 g/dL (ref 6.0–8.3)
Total Bilirubin: 0.7 mg/dL (ref 0.2–1.2)

## 2016-08-19 LAB — CBC WITH DIFFERENTIAL/PLATELET
Basophils Absolute: 0.1 10*3/uL (ref 0.0–0.1)
Basophils Relative: 1.1 % (ref 0.0–3.0)
Eosinophils Absolute: 0.2 10*3/uL (ref 0.0–0.7)
Eosinophils Relative: 2.7 % (ref 0.0–5.0)
HEMATOCRIT: 46.7 % (ref 39.0–52.0)
Hemoglobin: 16.3 g/dL (ref 13.0–17.0)
LYMPHS ABS: 2.1 10*3/uL (ref 0.7–4.0)
LYMPHS PCT: 28.5 % (ref 12.0–46.0)
MCHC: 35 g/dL (ref 30.0–36.0)
MCV: 86.6 fl (ref 78.0–100.0)
MONOS PCT: 7.4 % (ref 3.0–12.0)
Monocytes Absolute: 0.5 10*3/uL (ref 0.1–1.0)
NEUTROS ABS: 4.4 10*3/uL (ref 1.4–7.7)
NEUTROS PCT: 60.3 % (ref 43.0–77.0)
Platelets: 206 10*3/uL (ref 150.0–400.0)
RBC: 5.4 Mil/uL (ref 4.22–5.81)
RDW: 13.5 % (ref 11.5–15.5)
WBC: 7.4 10*3/uL (ref 4.0–10.5)

## 2016-08-19 LAB — TSH: TSH: 2.62 u[IU]/mL (ref 0.35–4.50)

## 2016-08-19 LAB — LIPID PANEL
CHOL/HDL RATIO: 5
Cholesterol: 167 mg/dL (ref 0–200)
HDL: 34.6 mg/dL — AB (ref >39.00)
LDL Cholesterol: 95 mg/dL (ref 0–99)
NONHDL: 132.33
Triglycerides: 188 mg/dL — ABNORMAL HIGH (ref 0.0–149.0)
VLDL: 37.6 mg/dL (ref 0.0–40.0)

## 2016-08-19 LAB — HEMOGLOBIN A1C: Hgb A1c MFr Bld: 5.3 % (ref 4.6–6.5)

## 2016-08-19 NOTE — Progress Notes (Signed)
Pre visit review using our clinic review tool, if applicable. No additional management support is needed unless otherwise documented below in the visit note. 

## 2016-08-20 DIAGNOSIS — R10813 Right lower quadrant abdominal tenderness: Secondary | ICD-10-CM | POA: Insufficient documentation

## 2016-08-20 DIAGNOSIS — R739 Hyperglycemia, unspecified: Secondary | ICD-10-CM | POA: Insufficient documentation

## 2016-08-20 DIAGNOSIS — N509 Disorder of male genital organs, unspecified: Secondary | ICD-10-CM

## 2016-08-20 DIAGNOSIS — N50819 Testicular pain, unspecified: Secondary | ICD-10-CM | POA: Insufficient documentation

## 2016-08-20 NOTE — Assessment & Plan Note (Signed)
Minimal tenderness on exam - pain has improved Associated with testicle tenderness Will refer to urology for further evaluation - hopefully pain will resolve and he can cancel appt If worsens or does not resolve will need imaging

## 2016-08-20 NOTE — Assessment & Plan Note (Signed)
Check a1c 

## 2016-08-20 NOTE — Assessment & Plan Note (Signed)
Exam deferred - deferred to urology Will defer imaging - given high deductible plan - will have urology determine if imaging is needed If pain resolves - can cancel urology appt If pain worsens will let me know

## 2017-02-05 NOTE — Progress Notes (Signed)
Subjective:    Patient ID: Alexander CommonGustavo Mcclain, male    DOB: Jul 30, 1985, 31 y.o.   MRN: [id number]  HPI The patient is here for follow up.  He still has occastional right testicle pain.  He did see urology an he was not concerned - I do not see a note in his chart from them. He thought he may have a hernia or a kidney stone.  He did give him something to help eliminate a kidney stone but he does not think it did anything.  He still has intermittent pain that is mild.  He just wanted to make sure it was not concerning.     Discomfort at base of sternum, under rib cage - he feels a bulging and discomfort in this area that is intermittent.  He denies pain.   The pain occurs randomly and it not related to any specific activity or food.  It does not occur daily.   Erectile dysfunction:  At times he has difficulty getting or maintaining an erection.  He is under a lot of stress now and he understands that may be affecting this.  He has no heart disease and does not take any medication.  He feels the problems is worse when the stress level is high.  He does have difficulty sleeping at times.  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    Medications and allergies reviewed with patient and updated if appropriate.  Patient Active Problem List   Diagnosis Date Noted  . Testicle tenderness 08/20/2016  . Hyperglycemia 08/20/2016  . RLQ abdominal tenderness 08/20/2016    No current outpatient prescriptions on file prior to visit.   No current facility-administered medications on file prior to visit.     Past Medical History:  Diagnosis Date  . Chicken pox     Past Surgical History:  Procedure Laterality Date  . NO PAST SURGERIES      Social History   Social History  . Marital status: Married    Spouse name: N/A  . Number of children: N/A  . Years of education: N/A   Social History Main Topics  . Smoking status: Never Smoker  . Smokeless tobacco: Never Used   . Alcohol use Yes     Comment: occ  . Drug use: No  . Sexual activity: Not on file   Other Topics Concern  . Not on file   Social History Narrative   Exercise: not regular    Family History  Problem Relation Age of Onset  . Hyperlipidemia Father   . Stroke Maternal Grandmother   . Diabetes Maternal Grandmother   . Diabetes Paternal Grandmother   . Diabetes Paternal Grandfather   . Diabetes Maternal Grandfather     Review of Systems  Constitutional: Negative for chills and fever.  Gastrointestinal: Positive for abdominal pain (more of a discomfort not pain). Negative for nausea.       No gerd       Objective:   Vitals:   02/06/17 0903  BP: 112/80  Pulse: 69  Resp: 16  Temp: 98.9 F (37.2 C)  SpO2: 98%   Wt Readings from Last 3 Encounters:  02/06/17 207 lb (93.9 kg)  08/19/16 211 lb (95.7 kg)  12/10/15 208 lb (94.3 kg)   Body mass index is 32.42 kg/m.   Physical Exam    Constitutional: Appears well-developed and well-nourished. No distress.  HENT:  Head: Normocephalic and  atraumatic.  chest wall:  area of discomfort is the xiphoid process - no discomfort with palpation of sternum, ribs or xiphoid process Abdomen: soft, non tender, non distended Skin: Skin is warm and dry. Not diaphoretic. No erythema or skin changes. Psychiatric: Normal mood and affect. Behavior is normal.      Assessment & Plan:    See Problem List for Assessment and Plan of chronic medical problems.

## 2017-02-06 ENCOUNTER — Encounter: Payer: Self-pay | Admitting: Internal Medicine

## 2017-02-06 ENCOUNTER — Ambulatory Visit (INDEPENDENT_AMBULATORY_CARE_PROVIDER_SITE_OTHER): Payer: BLUE CROSS/BLUE SHIELD | Admitting: Internal Medicine

## 2017-02-06 VITALS — BP 112/80 | HR 69 | Temp 98.9°F | Resp 16 | Wt 207.0 lb

## 2017-02-06 DIAGNOSIS — N509 Disorder of male genital organs, unspecified: Secondary | ICD-10-CM

## 2017-02-06 DIAGNOSIS — M948X8 Other specified disorders of cartilage, other site: Secondary | ICD-10-CM | POA: Diagnosis not present

## 2017-02-06 DIAGNOSIS — N529 Male erectile dysfunction, unspecified: Secondary | ICD-10-CM | POA: Diagnosis not present

## 2017-02-06 DIAGNOSIS — N50819 Testicular pain, unspecified: Secondary | ICD-10-CM

## 2017-02-06 MED ORDER — SILDENAFIL CITRATE 20 MG PO TABS: ORAL_TABLET | ORAL | 3 refills | Status: DC

## 2017-02-06 NOTE — Assessment & Plan Note (Signed)
Likely inflammation of xiphoid area Intermittent, no current pain Ice/heat nsaids Call if no improvement

## 2017-02-06 NOTE — Assessment & Plan Note (Signed)
Likely psychogenic Reassured  - work on stress relief Will try low dose generic viagra as needed  - discussed risk of headache Call with questions

## 2017-02-06 NOTE — Assessment & Plan Note (Signed)
Will monitor and see urology again if persists.

## 2017-02-06 NOTE — Patient Instructions (Signed)
You have inflammation of the xiphoid process at the base of your sternum.  You can treat this with heat/ice and advil if needed.    Call if no improvement    A prescription of generic Viagra.  Use as directed and call if you have any side effects or questions.

## 2017-06-01 NOTE — Progress Notes (Signed)
Subjective:    Patient ID: Alexander Mcclain, male    DOB: August 14, 1985, 32 y.o.   MRN: [id number]  HPI The patient is here for an acute visit.  Stomach issues:  It started about 4-5 months ago.  He has a constant burning sensation in his abdomen.  Usually it is in the upper abdomen, bu tit can be in different areas.  His bowels are normal. Spicy foods make it worse.  His wife has given him zantac prior to spicy foods and it has helped.       Medications and allergies reviewed with patient and updated if appropriate.  Patient Active Problem List   Diagnosis Date Noted  . 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 Erectile dysfunction 02/06/2017  . Testicle tenderness 08/20/2016  . Hyperglycemia 08/20/2016  . RLQ abdominal tenderness 08/20/2016    Current Outpatient Medications on File Prior to Visit  Medication Sig Dispense Refill  . sildenafil (REVATIO) 20 MG tablet 1-3 tabs daily as needed 20 tablet 3   No current facility-administered medications on file prior to visit.     Past Medical History:  Diagnosis Date  . Chicken pox     Past Surgical History:  Procedure Laterality Date  . NO PAST SURGERIES      Social History   Socioeconomic History  . Marital status: Married    Spouse name: None  . Number of children: None  . Years of education: None  . Highest education level: None  Social Needs  . Financial resource strain: None  . Food insecurity - worry: None  . Food insecurity - inability: None  . Transportation needs - medical: None  . Transportation needs - non-medical: None  Occupational History  . None  Tobacco Use  . Smoking status: Never Smoker  . Smokeless tobacco: Never Used  Substance and Sexual Activity  . Alcohol use: Yes    Comment: occ  . Drug use: No  . Sexual activity: None  Other Topics Concern  . None  Social History Narrative   Exercise: not regular    Family History  Problem Relation Age of Onset  . Hyperlipidemia Father   . Stroke Maternal  Grandmother   . Diabetes Maternal Grandmother   . Diabetes Paternal Grandmother   . Diabetes Paternal Grandfather   . Diabetes Maternal Grandfather     Review of Systems  Constitutional: Negative for appetite change, chills, diaphoresis, fever and unexpected weight change.  Gastrointestinal: Positive for abdominal distention (gas). Negative for abdominal pain (upset stomach), blood in stool, constipation, diarrhea, nausea and vomiting.       Increased burping, no reflux  Genitourinary: Negative for dysuria and hematuria.  Neurological: Negative for light-headedness and headaches.       Objective:   Vitals:   06/02/17 0746  BP: 102/74  Pulse: 81  Resp: 16  Temp: 97.6 F (36.4 C)  SpO2: 98%   Wt Readings from Last 3 Encounters:  06/02/17 210 lb (95.3 kg)  02/06/17 207 lb (93.9 kg)  08/19/16 211 lb (95.7 kg)   Body mass index is 32.89 kg/m.   Physical Exam  Constitutional: He appears well-developed and well-nourished. No distress.  HENT:  Head: Normocephalic and atraumatic.  Abdominal: Soft. He exhibits no distension and no mass. There is tenderness (minimal discomfort in RUQ and epigastric region). There is no rebound and no guarding.  Musculoskeletal: He exhibits no edema.  Skin: Skin is warm and dry. He is not diaphoretic. No erythema.  Assessment & Plan:    See Problem List for Assessment and Plan of chronic medical problems.

## 2017-06-02 ENCOUNTER — Encounter: Payer: Self-pay | Admitting: Internal Medicine

## 2017-06-02 ENCOUNTER — Ambulatory Visit (INDEPENDENT_AMBULATORY_CARE_PROVIDER_SITE_OTHER): Payer: BLUE CROSS/BLUE SHIELD | Admitting: Internal Medicine

## 2017-06-02 VITALS — BP 102/74 | HR 81 | Temp 97.6°F | Resp 16 | Wt 210.0 lb

## 2017-06-02 DIAGNOSIS — R1013 Epigastric pain: Secondary | ICD-10-CM | POA: Diagnosis not present

## 2017-06-02 DIAGNOSIS — K219 Gastro-esophageal reflux disease without esophagitis: Secondary | ICD-10-CM | POA: Diagnosis not present

## 2017-06-02 MED ORDER — SILDENAFIL CITRATE 20 MG PO TABS: ORAL_TABLET | ORAL | 3 refills | Status: AC

## 2017-06-02 MED ORDER — OMEPRAZOLE 20 MG PO CPDR: 20.0000 mg | DELAYED_RELEASE_CAPSULE | Freq: Every day | ORAL | 3 refills | Status: AC

## 2017-06-02 NOTE — Patient Instructions (Signed)
An abdominal ultrasound was ordered.  Someone will call you to schedule this.    Your results will be released to MyChart (or called to you) after review, usually within 72hours after test completion. If any changes need to be made, you will be notified at that same time.  Medications reviewed and updated.  Changes include starting omeprazole 20 mg daily - take 30 minutes prior a meal.   Your prescription(s) have been submitted to your pharmacy. Please take as directed and contact our office if you believe you are having problem(s) with the medication(s).

## 2017-06-02 NOTE — Assessment & Plan Note (Signed)
Symptoms likely related to gerd Start omeprazole 20 mg daily Will check abdominal US to f/o GB disease Avoid spicy foods - he does eat a lot of spicy foods  Call if no improvement

## 2017-06-02 NOTE — Assessment & Plan Note (Signed)
Minimal tenderness in RUQ and epigastric region ? GERD/gastritis, GB disease Will get an abdominal US Start omeprazole 20 mg daily If symptoms persist will pursue further evaluation - a friend of his just was diagnosed with pancreatic cancer and he is concerned about that

## 2017-06-15 ENCOUNTER — Other Ambulatory Visit: Payer: BLUE CROSS/BLUE SHIELD

## 2017-06-15 ENCOUNTER — Ambulatory Visit
Admission: RE | Admit: 2017-06-15 | Discharge: 2017-06-15 | Disposition: A | Discharge status: Home / Self Care | Payer: BLUE CROSS/BLUE SHIELD | Source: Ambulatory Visit | Attending: Internal Medicine | Admitting: Internal Medicine

## 2017-06-15 DIAGNOSIS — R1013 Epigastric pain: Secondary | ICD-10-CM

## 2017-06-17 ENCOUNTER — Telehealth: Payer: Self-pay | Admitting: Internal Medicine

## 2017-06-17 NOTE — Telephone Encounter (Signed)
Copied from CRM [phone]. Topic: Quick Communication - See Telephone Encounter >> Jun 17, 2017  4:31 PM Rudi CocoLathan, Hajime Asfaw M, NT wrote: CRM for notification. See Telephone encounter for:   06/17/17. Pt. Calling to get results from ultra sound cb# [phone]

## 2017-06-18 ENCOUNTER — Encounter: Payer: Self-pay | Admitting: Internal Medicine

## 2017-06-18 DIAGNOSIS — K8020 Calculus of gallbladder without cholecystitis without obstruction: Secondary | ICD-10-CM | POA: Insufficient documentation

## 2017-06-18 DIAGNOSIS — K760 Fatty (change of) liver, not elsewhere classified: Secondary | ICD-10-CM | POA: Insufficient documentation

## 2017-06-18 NOTE — Telephone Encounter (Signed)
He does have some stones in his gallbladder.  This may be causing some of his symptoms, but heartburn could be causing it too.     If he wants to see a surgeon to discuss having his gallbladder taken out I can refer him.

## 2017-06-18 NOTE — Telephone Encounter (Signed)
Spoke with pt to inform. Pt states that he may be transferring to a new job in [place] in the next 3 weeks so he will come by the office to have a release signed to transfer the records to continue with treatment in [place].

## 2018-06-06 IMAGING — DX DG CHEST 2V
2 series · 2 of 2 positions shown · non-contrast
Comparison: None.

CLINICAL DATA: Intermittent left-sided back pain for the past 6
months ; increased symptoms over the past 3 months. No known injury.

EXAM:
CHEST  2 VIEW

[chest pa]
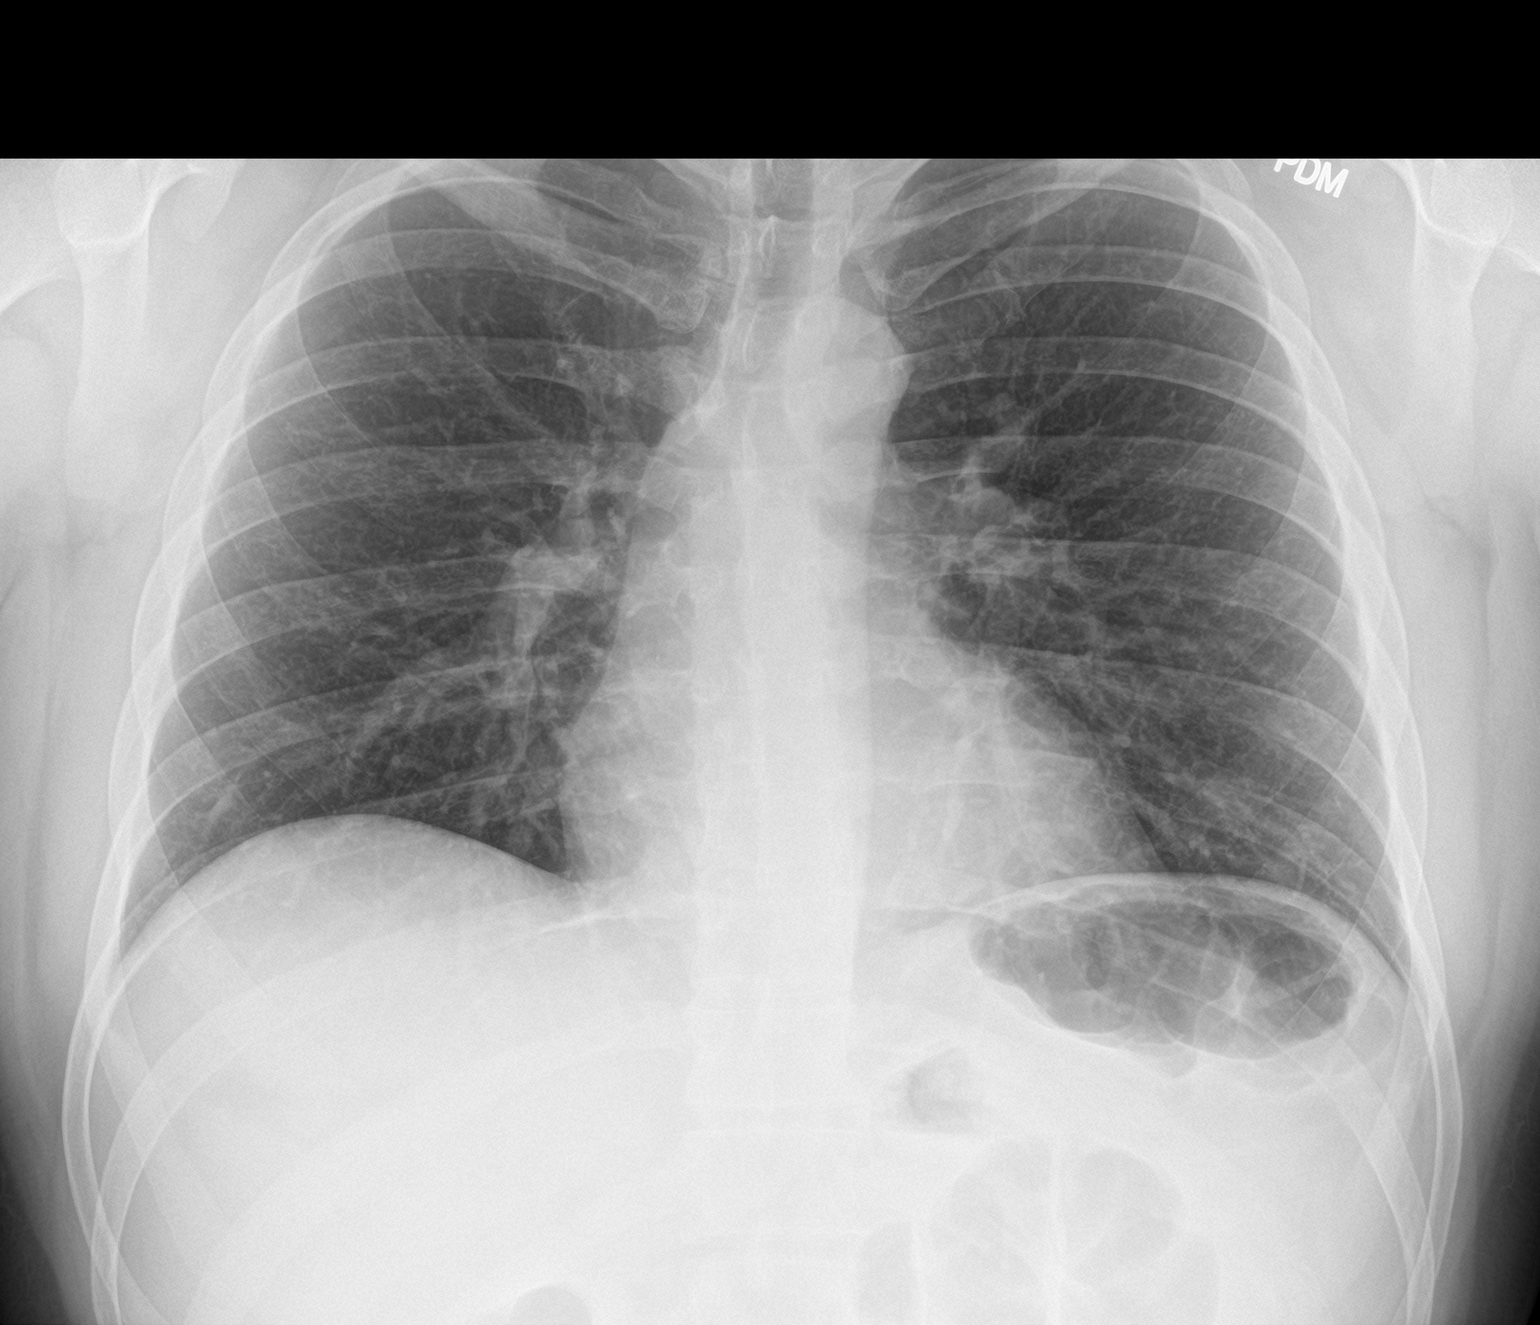

[chest lat]
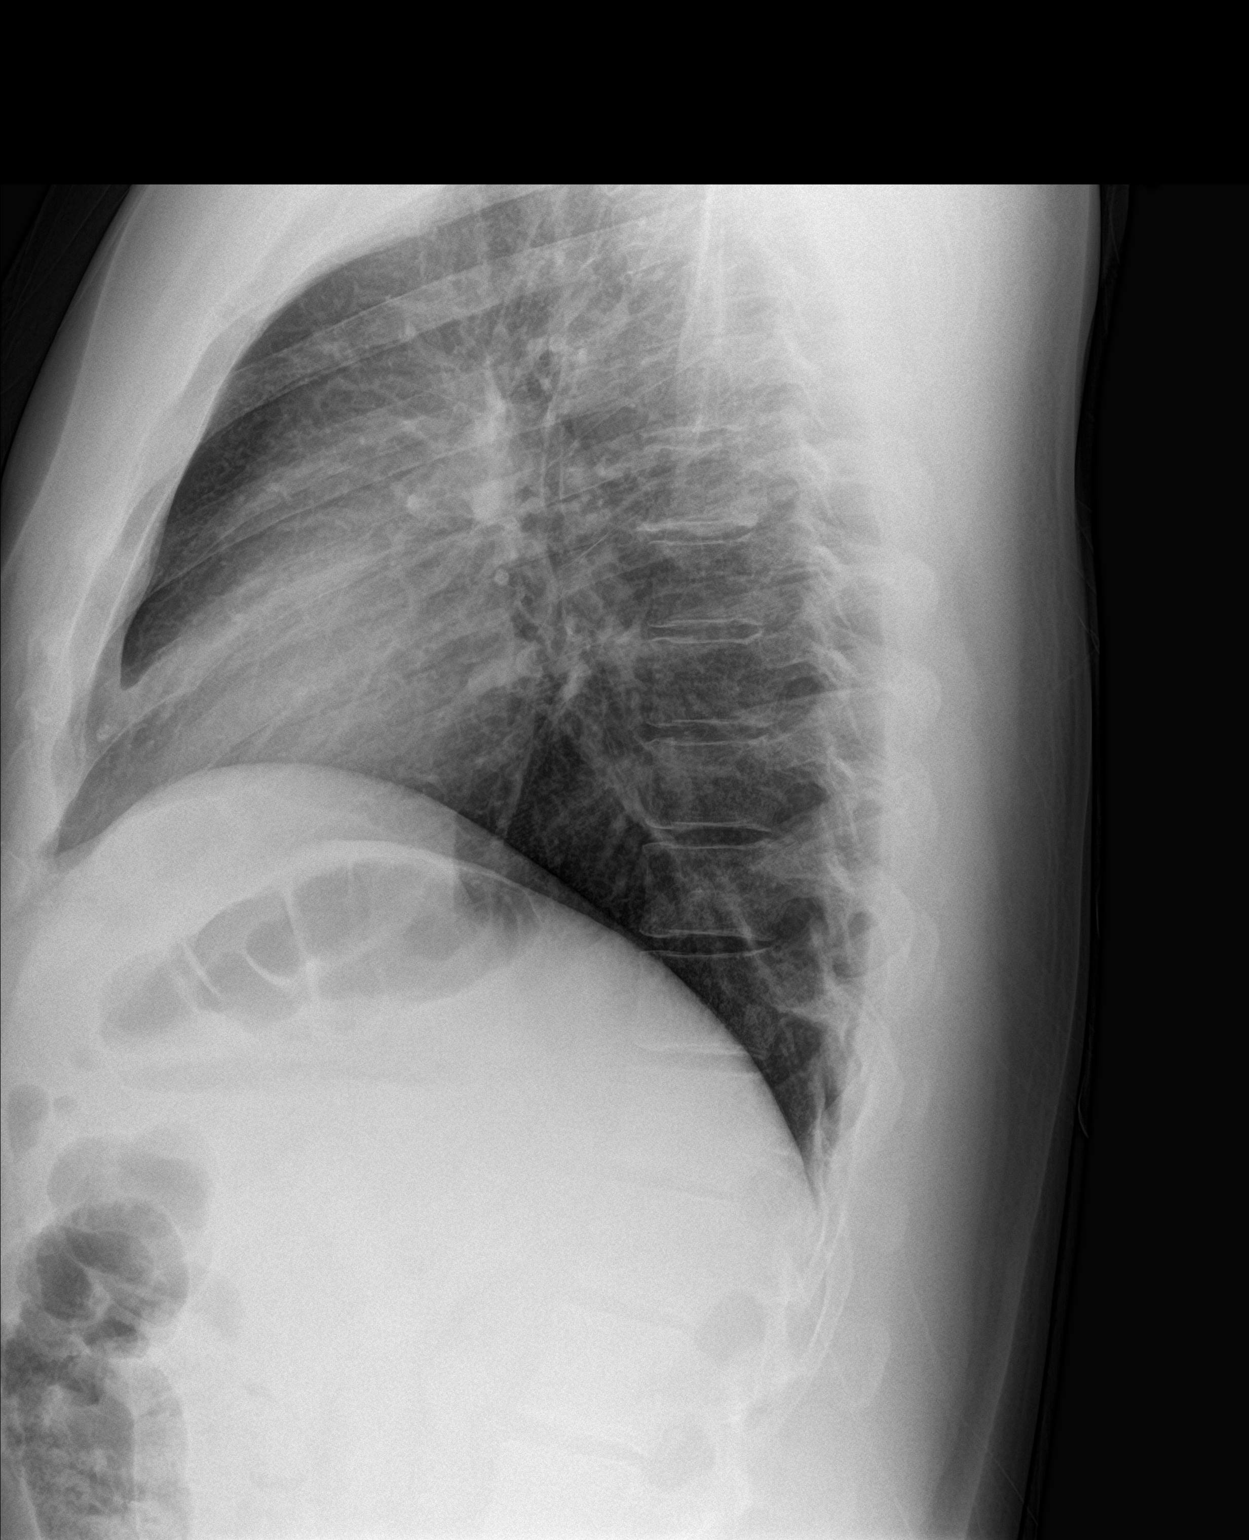

[2 of 2 positions shown; findings below may reference images not displayed]

FINDINGS: The lungs are reasonably well inflated and clear. The heart and
pulmonary vascularity are normal. The mediastinum is normal in
width. There is no pleural effusion. The bony thorax exhibits no
acute abnormality. There is gentle curvature convex toward the right
centered at approximately T5.
IMPRESSION: No active cardiopulmonary disease.

The thoracic spine exhibits no acute abnormality.

## 2018-10-13 IMAGING — US US ABDOMEN COMPLETE
1 series · 14 of 25 positions shown · non-contrast
Comparison: None.

CLINICAL DATA: Right upper quadrant abdomen pain for 6 months

EXAM:
ABDOMEN ULTRASOUND COMPLETE

[Series 1: us abdomen complete · 0.23mm/px · 14 of 69 slices shown]
[im 1/69]
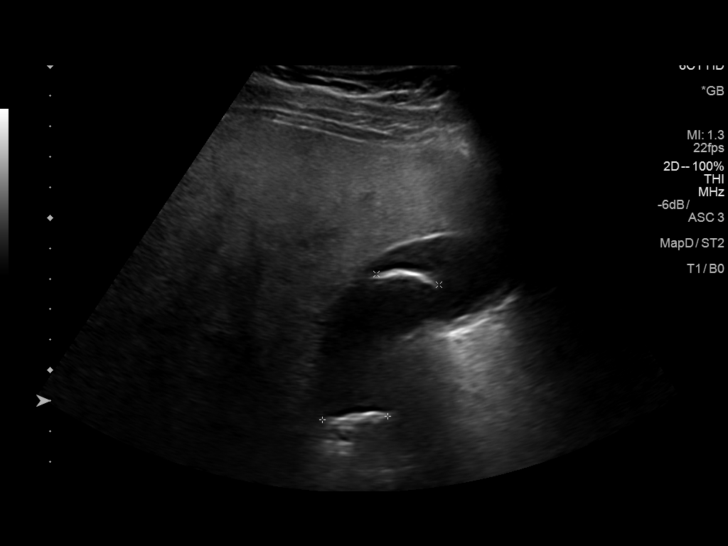
[im 6/69]
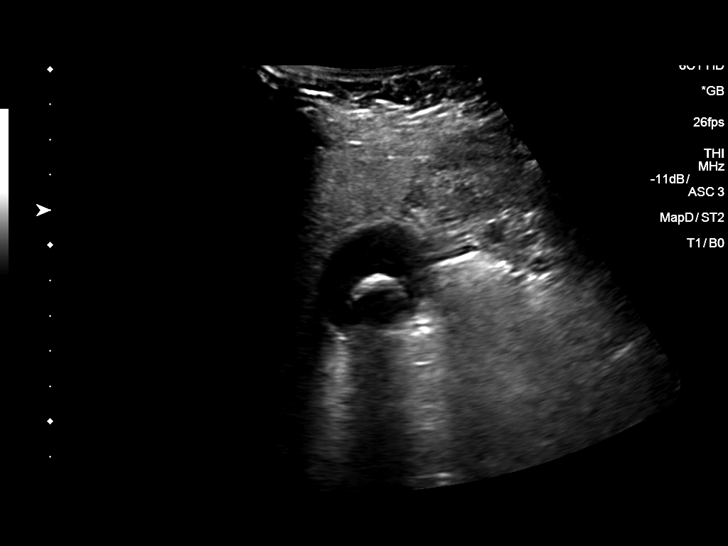
[im 12/69]
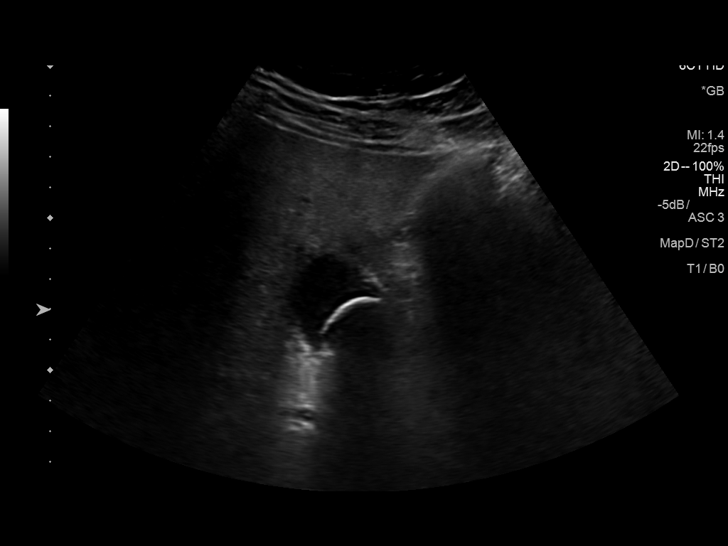
[im 18/69]
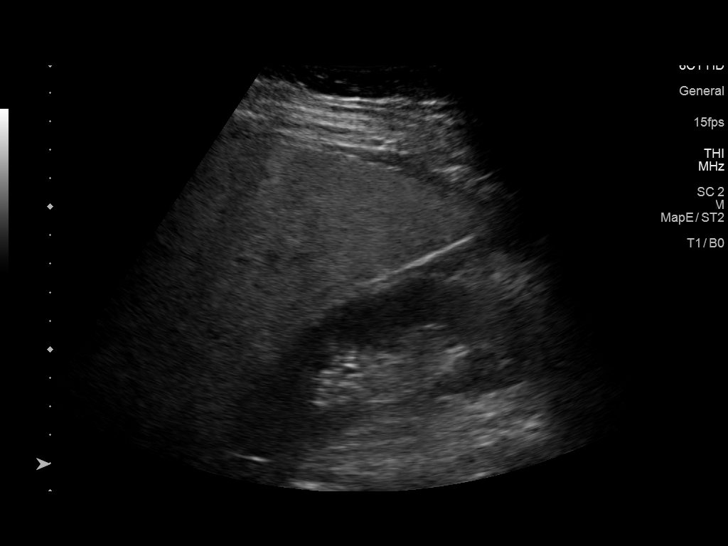
[im 23/69]
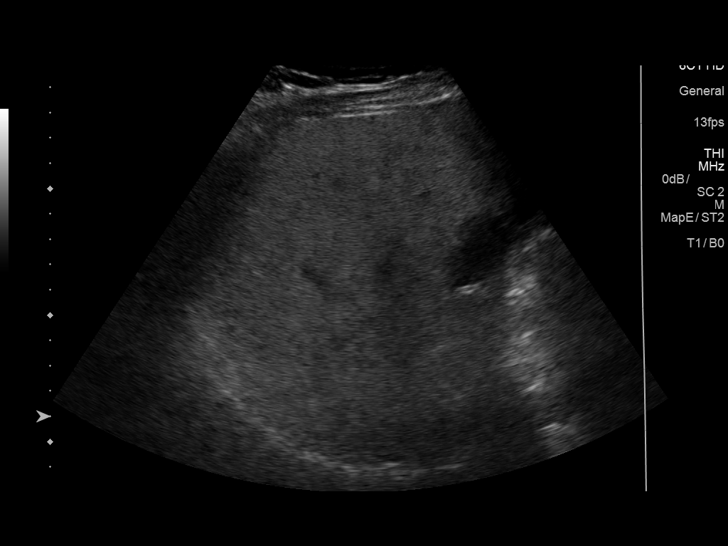
[im 26/69]
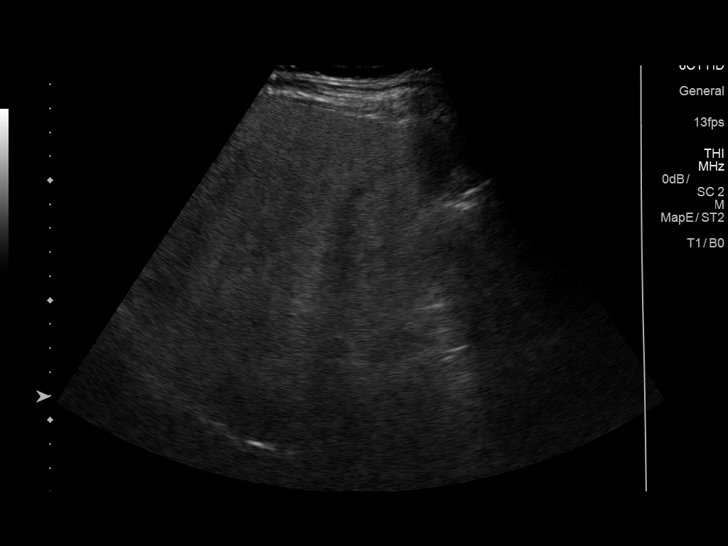
[im 32/69]
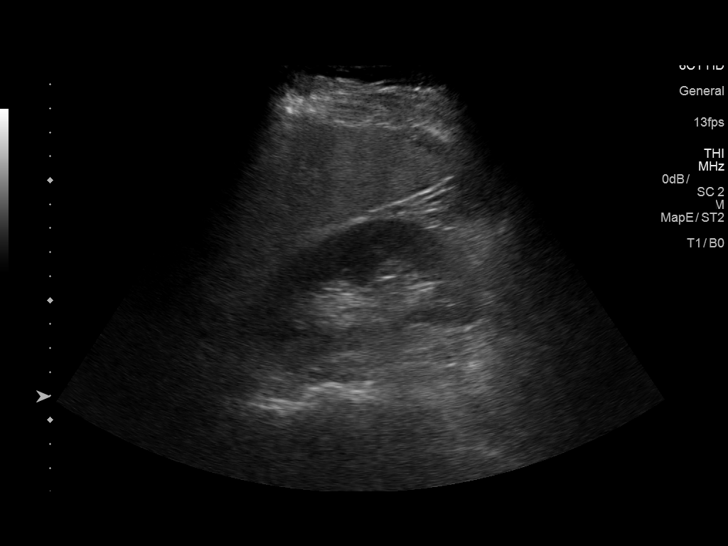
[im 37/69]
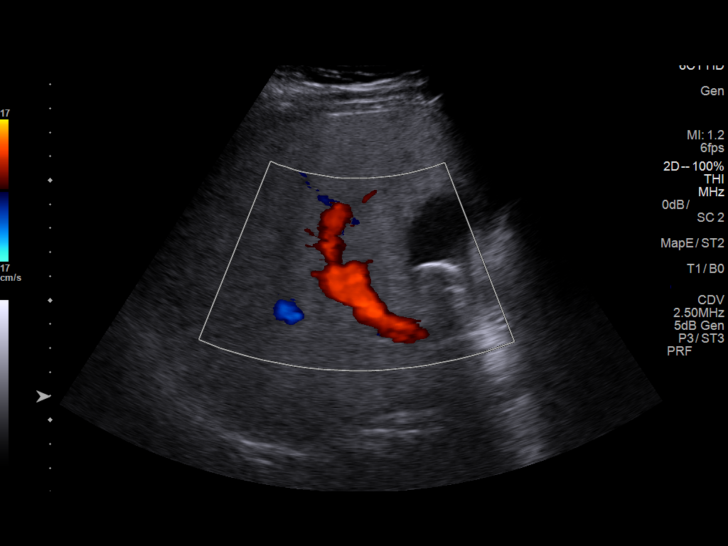
[im 43/69]
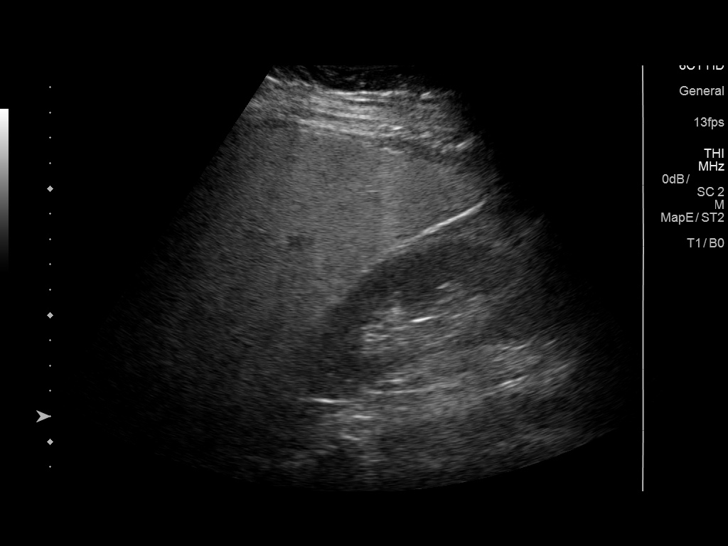
[im 46/69]
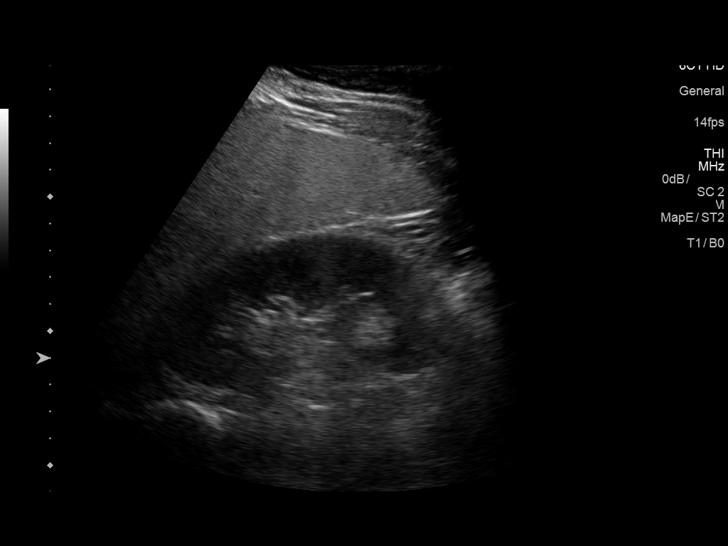
[im 52/69]
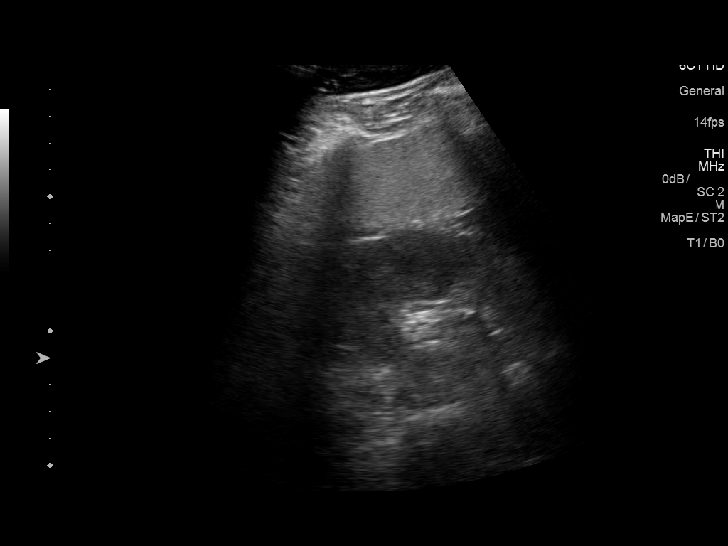
[im 57/69]
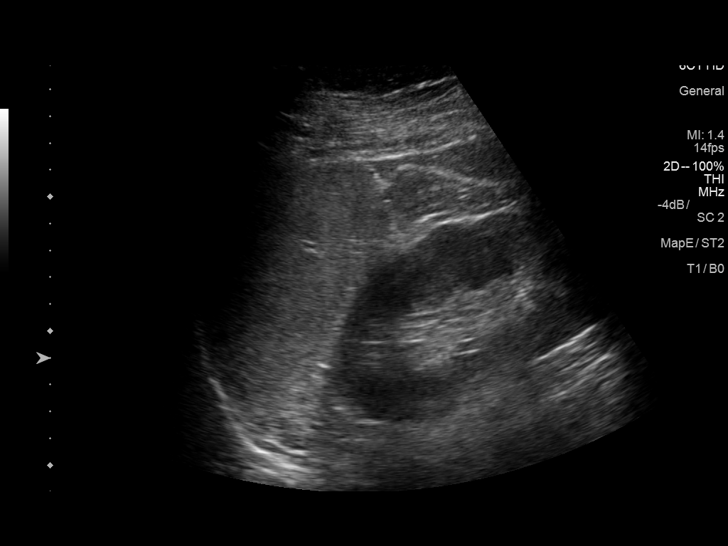
[im 63/69]
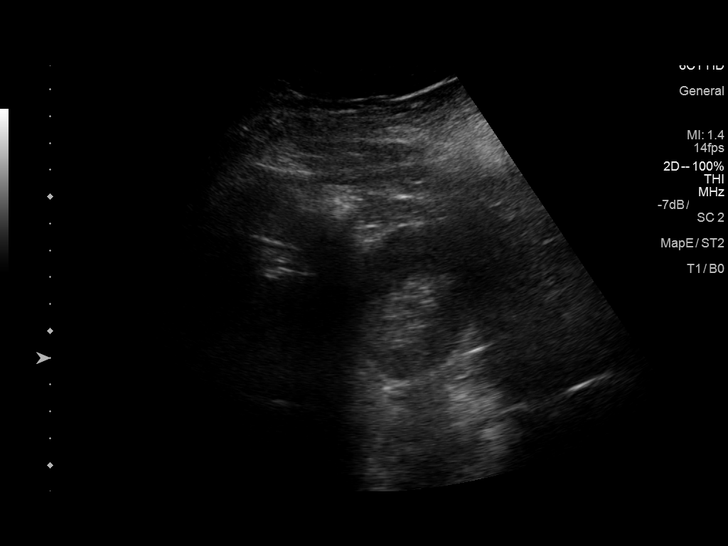
[im 69/69]
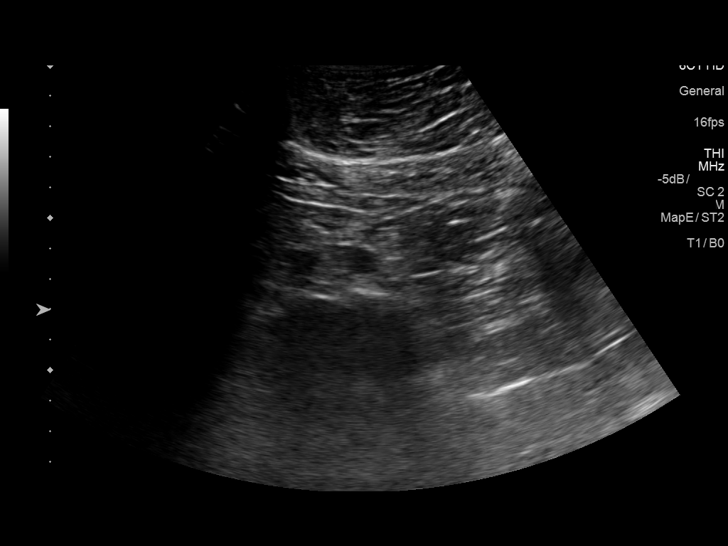

[14 of 25 positions shown; findings below may reference images not displayed]

FINDINGS: Gallbladder: Gallstones are noted. No wall thickening visualized. No
sonographic Murphy sign noted by sonographer.

Common bile duct: Diameter: 2.9 mm

Liver: No focal lesion identified. There is diffuse increased
echotexture of the liver. Portal vein is patent on color Doppler
imaging with normal direction of blood flow towards the liver.

IVC: Not well seen due to overlying bowel gas.

Pancreas: Not well seen due to overlying bowel gas.

Spleen: Size and appearance within normal limits.

Right Kidney: Length: 12.5 cm. Echogenicity within normal limits. No
mass or hydronephrosis visualized.

Left Kidney: Length: 12.7 cm. Echogenicity within normal limits. No
mass or hydronephrosis visualized.

Abdominal aorta: No aneurysm visualized.

Other findings: None.
IMPRESSION: Cholelithiasis without sonographic evidence of acute cholecystitis.

Fatty infiltration of liver.
